# Patient Record
Sex: Male | Born: 1975 | Race: White | Hispanic: No | Marital: Married | Smoking: Never smoker
Health system: Southern US, Community
[De-identification: ages and names within clinical notes are randomized; demographics above are authoritative.]

---

## 2011-04-29 ENCOUNTER — Ambulatory Visit: Payer: Self-pay | Admitting: Family Medicine

## 2012-04-08 IMAGING — RF DG UGI W/ KUB
1 series · 15 of 24 positions shown · non-contrast
Comparison: none

REASON FOR EXAM: dysphagia
COMMENTS:

[Series 1: run · 28 acquisitions, 15 frames shown]
[im 1/28]
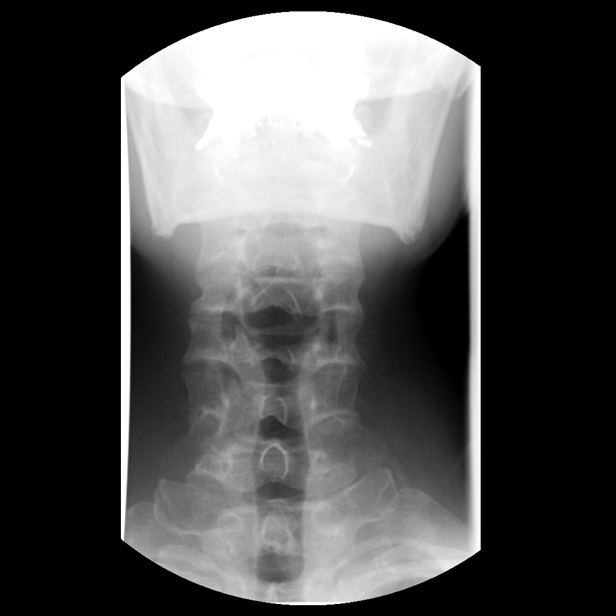
[im 1/28]
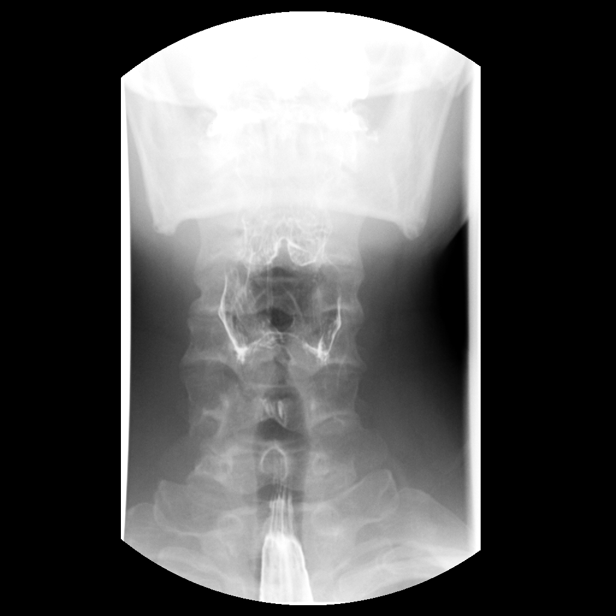
[im 2/28]
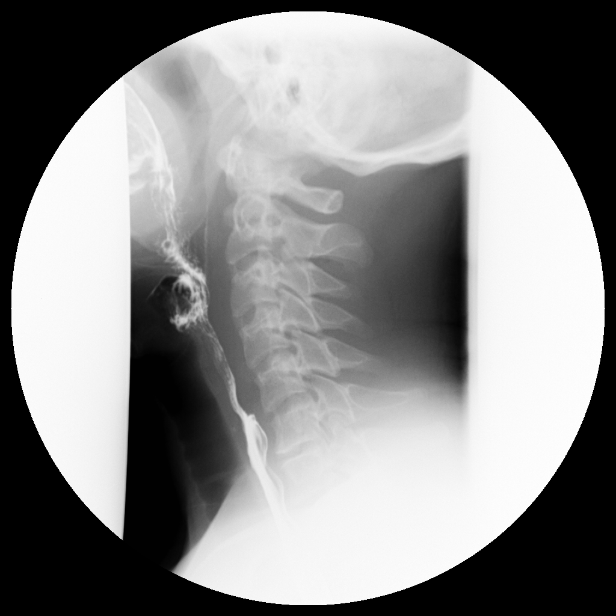
[im 3/28]
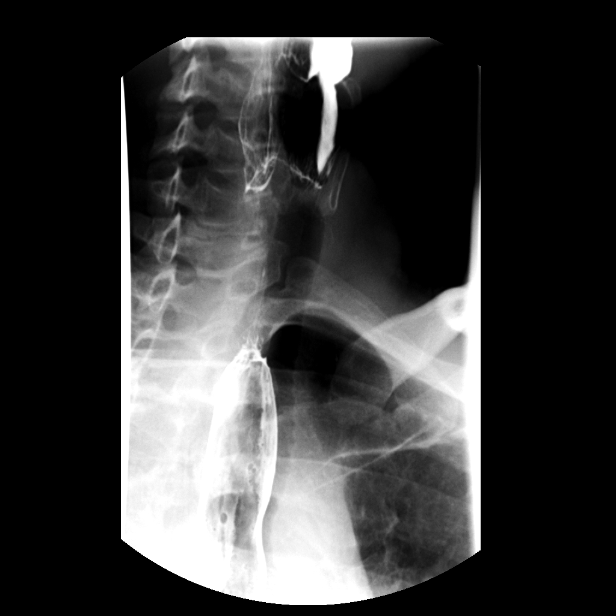
[im 4/28]
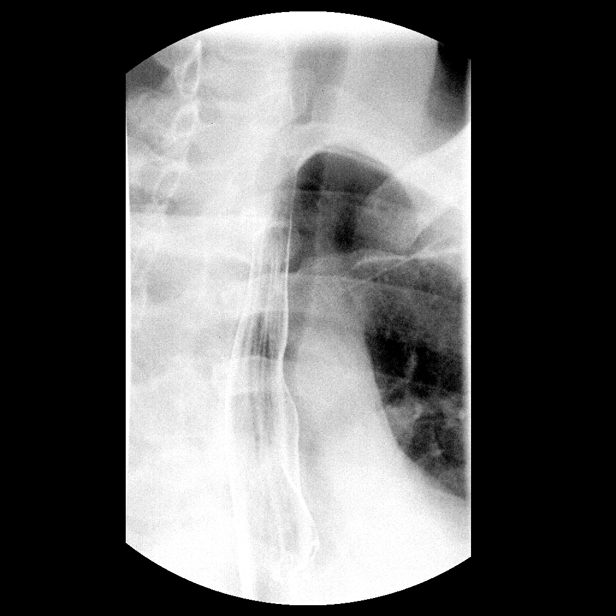
[im 5/28]
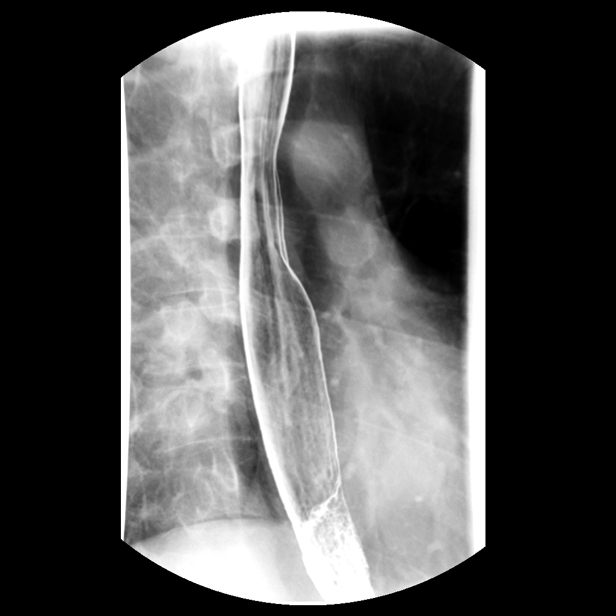
[im 8/28]
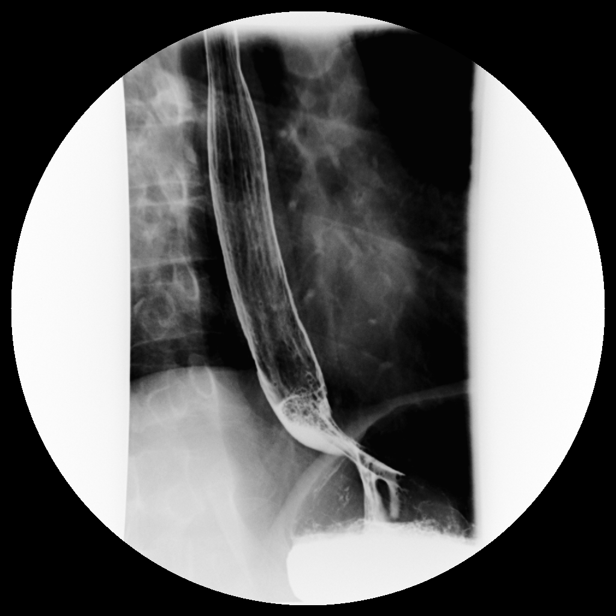
[im 9/28]
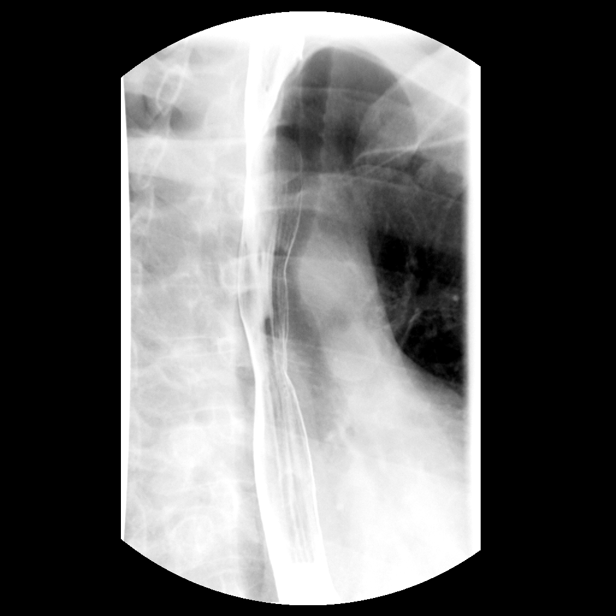
[im 10/28]
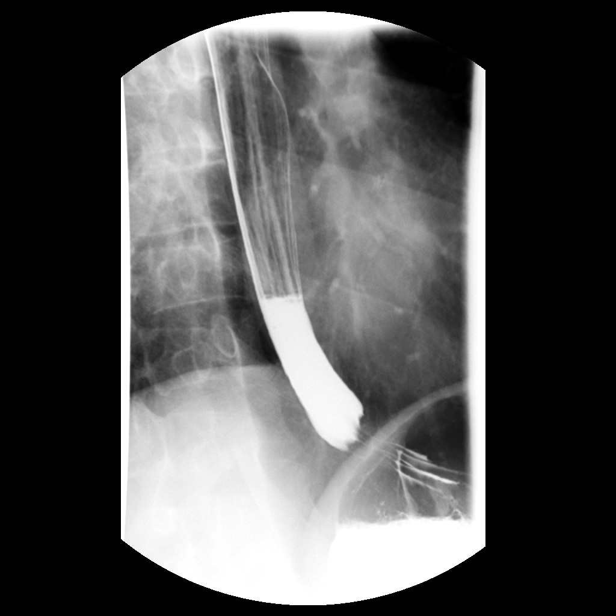
[im 12/28]
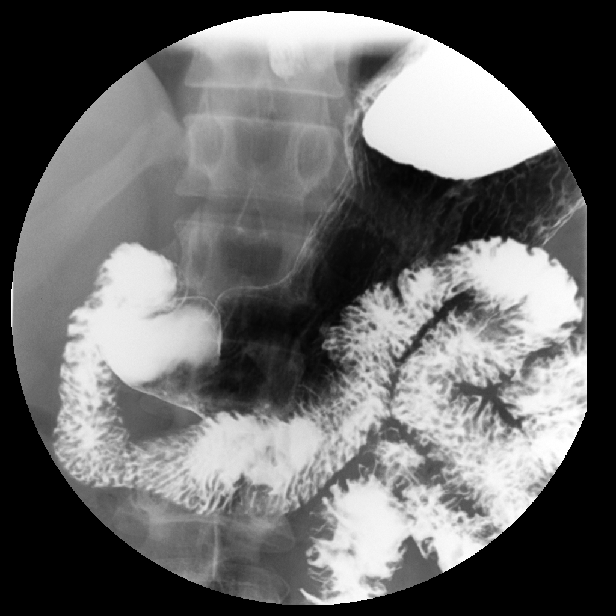
[im 15/28]
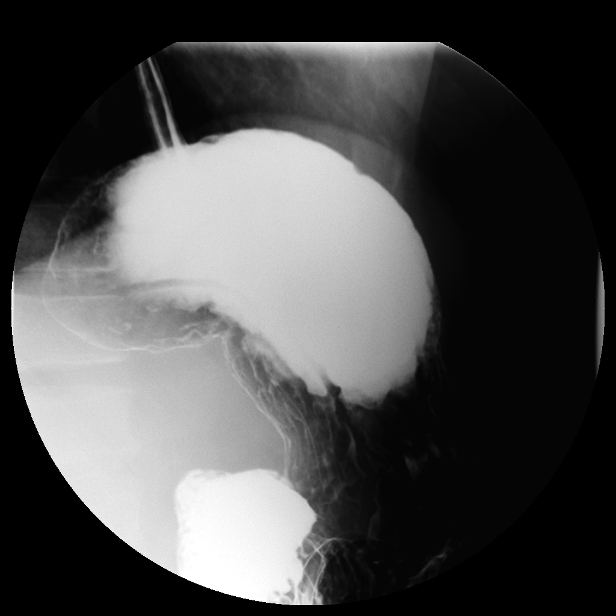
[im 18/28]
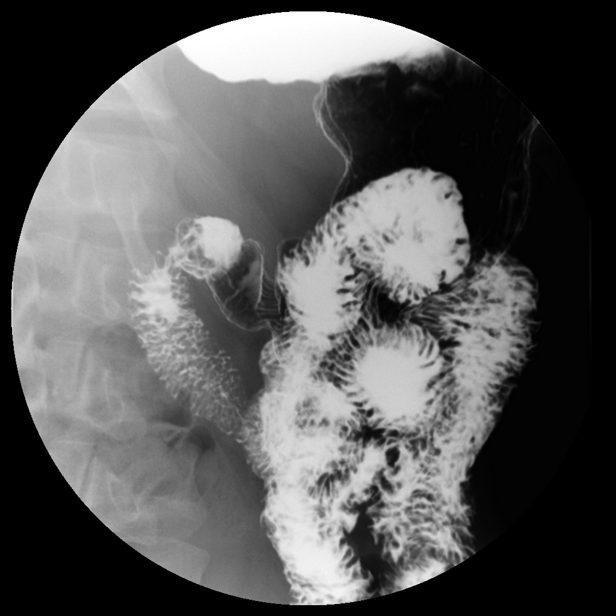
[im 22/28]
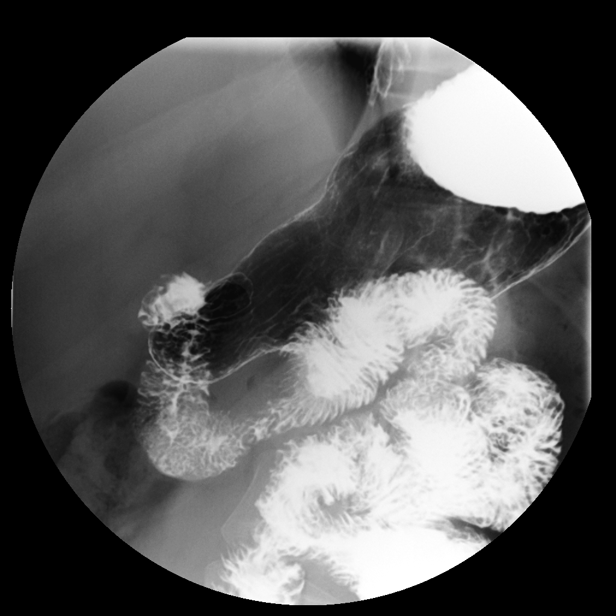
[im 24/28]
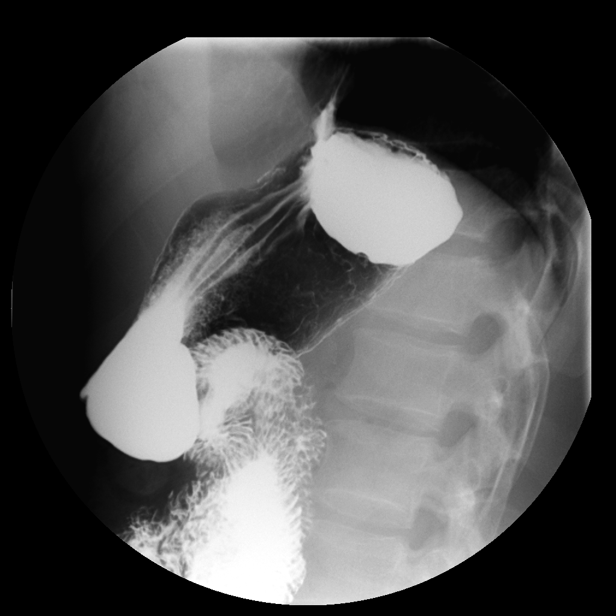
[im 28/28]
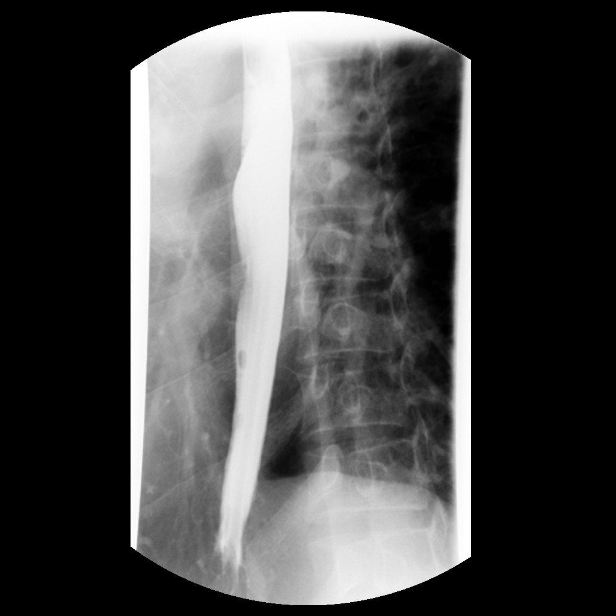

[15 of 24 positions shown; findings below may reference images not displayed]

PROCEDURE:     FL  - FL UPPER GI W/ BARIUM SWALLOW  - April 29, 2011 [DATE]

RESULT:     Following oral administration of effervescent crystals, barium
was administered and spot imaging of the esophagus and upper gastric
gastrointestinal tract was obtained. Images were obtained during oral barium
administration.
FINDINGS: Dynamic evaluation of the cervical esophagus during active
swallowing is unremarkable. The thoracic esophagus is unremarkable. There is
appropriate relaxation of the lower esophageal sphincter. There is no
evidence of hiatal hernia, foci of strictural narrowing, segmental
narrowing, focal outpouchings or fusiform dilatation. Minimal
gastroesophageal reflux was elicited to the lower esophagus. A 12.5 mm
Barotab was administered which traversed the esophagus without complications.

The stomach, duodenum and duodenal bulb are unremarkable. There is
appropriate rotation of the duodenum and placement of the ligament of Treitz.
IMPRESSION: Minimal gastroesophageal reflux otherwise unremarkable upper GI/barium
swallow.

## 2022-11-18 ENCOUNTER — Ambulatory Visit (INDEPENDENT_AMBULATORY_CARE_PROVIDER_SITE_OTHER): Payer: Self-pay | Admitting: Family Medicine

## 2022-11-18 ENCOUNTER — Encounter: Payer: Self-pay | Admitting: Family Medicine

## 2022-11-18 VITALS — BP 120/78 | HR 68 | Ht 69.0 in | Wt 202.0 lb

## 2022-11-18 DIAGNOSIS — Z6829 Body mass index (BMI) 29.0-29.9, adult: Secondary | ICD-10-CM

## 2022-11-18 NOTE — Patient Instructions (Signed)
GUIDELINES FOR  LOW-CHOLESTEROL, LOW-TRIGLYCERIDE DIETS    FOODS TO USE   MEATS, FISH Choose lean meats (chicken, [place], veal, and non-fatty cuts of beef with excess fat trimmed; one serving = 3 oz of cooked meat). Also, fresh or frozen fish, canned fish packed in water, and shellfish (lobster, crabs, shrimp, and oysters). Limit use to no more than one serving of one of these per week. Shellfish are high in cholesterol but low in saturated fat and should be used sparingly. Meats and fish should be broiled (pan or oven) or baked on a rack.  EGGS Egg substitutes and egg whites (use freely). Egg yolks (limit two per week).  FRUITS Eat three servings of fresh fruit per day (1 serving =  cup). Be sure to have at least one citrus fruit daily. Frozen and canned fruit with no sugar or syrup added may be used.  VEGETABLES Most vegetables are not limited (see next page). One dark-green (string beans, escarole) or one deep yellow (squash) vegetable is recommended daily. Cauliflower, broccoli, and celery, as well as potato skins, are recommended for their fiber content. (Fiber is associated with cholesterol reduction) It is preferable to steam vegetables, but they may be boiled, strained, or braised with polyunsaturated vegetable oil (see below).  BEANS Dried peas or beans (1 serving =  cup) may be used as a bread substitute.  NUTS Almonds, walnuts, and peanuts may be used sparingly  (1 serving = 1 Tablespoonful). Use pumpkin, sesame, or sunflower seeds.  BREADS, GRAINS One roll or one slice of whole grain or enriched bread may be used, or three soda crackers or four pieces of melba toast as a substitute. Spaghetti, rice or noodles ( cup) or  large ear of corn may be used as a bread substitute. In preparing these foods do not use butter or shortening, use soft margarine. Also use egg and sugar substitutes.  Choose high fiber grains, such as oats and whole wheat.  CEREALS Use  cup of hot cereal or  cup of  cold cereal per day. Add a sugar substitute if desired, with 99% fat free or skim milk.  MILK PRODUCTS Always use 99% fat free or skim milk, dairy products such as low fat cheeses (farmer's uncreamed diet cottage), low-fat yogurt, and powdered skim milk.  FATS, OILS Use soft (not stick) margarine; vegetable oils that are high in polyunsaturated fats (such as safflower, sunflower, soybean, corn, and cottonseed). Always refrigerate meat drippings to harden the fat and remove it before preparing gravies  DESSERTS, SNACKS Limit to two servings per day; substitute each serving for a bread/cereal serving: ice milk, water sherbet (1/4 cup); unflavored gelatin or gelatin flavored with sugar substitute (1/3 cup); pudding prepared with skim milk (1/2 cup); egg white souffls; unbuttered popcorn (1  cups). Substitute carob for chocolate.  BEVERAGES Fresh fruit juices (limit 4 oz per day); black coffee, plain or herbal teas; soft drinks with sugar substitutes; club soda, preferably salt-free; cocoa made with skim milk or nonfat dried milk and water (sugar substitute added if desired); clear broth. Alcohol: limit two servings per day (see second page).  MISCELLANEOUS  You may use the following freely: vinegar, spices, herbs, nonfat bouillon, mustard, Worcestershire sauce, soy sauce, flavoring essence.                  GUIDELINES FOR  LOW-CHOLESTEROL, LOW TRIGLYCERIDE DIETS    FOODS TO AVOID   MEATS, FISH Marbled beef, pork, bacon, sausage, and other pork products; fatty  fowl (duck, goose); skin and fat of turkey and chicken; processed meats; luncheon meats (salami, bologna); frankfurters and fast-food hamburgers (theyre loaded with fat); organ meats (kidneys, liver); canned fish packed in oil.  °EGGS Limit egg yolks to two per week.   °FRUITS Coconuts (rich in saturated fats).  °VEGETABLES Avoid avocados. Starchy vegetables (potatoes, corn, lima beans, dried peas, beans) may be used only if  substitutes for a serving of bread or cereal. (Baked potato skin, however, is desirable for its fiber content.  °BEANS Commercial baked beans with sugar and/or pork added.  °NUTS Avoid nuts.  Limit peanuts and walnuts to one tablespoonful per day.  °BREADS, GRAINS Any baked goods with shortening and/or sugar. Commercial mixes with dried eggs and whole milk. Avoid sweet rolls, doughnuts, breakfast pastries (Danish), and sweetened packaged cereals (the added sugar converts readily to triglycerides).  °MILK PRODUCTS Whole milk and whole-milk packaged goods; cream; ice cream; whole-milk puddings, yogurt, or cheeses; nondairy cream substitutes.  °FATS, OILS Butter, lard, animal fats, bacon drippings, gravies, cream sauces as well as palm and coconut oils. All these are high in saturated fats. Examine labels on cholesterol free products for hydrogenated fats. (These are oils that have been hardened into solids and in the process have become saturated.)  °DESSERTS, SNACKS Fried snack foods like potato chips; chocolate; candies in general; jams, jellies, syrups; whole- milk puddings; ice cream and milk sherbets; hydrogenated peanut butter.  °BEVERAGES Sugared fruit juices and soft drinks; cocoa made with whole milk and/or sugar. When using alcohol (1 oz liquor, 5 oz beer, or 2 ½ oz dry table wine per serving), one serving must be substituted for one bread or cereal serving (limit, two servings of alcohol per day).  ° SPECIAL NOTES  °  Remember that even non-limited foods should be used in moderation. °While on a cholesterol-lowering diet, be sure to avoid animal fats and marbled meats. °3. While on a triglyceride-lowering diet, be sure to avoid sweets and to control the amount of carbohydrates you eat (starchy foods such as flour, bread, potatoes).While on a tri-glyceride-lowering diet, be sure to avoid sweets °Buy a good low-fat cookbook, such as the one published by the American Heart Association. °Consult your physician  if you have any questions.  ° ° ° ° ° ° ° ° ° ° ° ° ° °[facility] Low Glycemic Diet Plan ° ° °Low Glycemic Foods (20-49) Moderate Glycemic Foods (50-69) High Glycemic Foods (70-100)  °    °Breakfast Creals Breakfast Cereals Breakfast Cereals  °All Bran All-Bran Fruit'n Oats  ° Bran Buds Bran Chex  ° Cheerios Corn chex  °  °Fiber One Oatmeal (not instant)  ° Just Right Mini-Wheats  ° Corn Flakes Cream of Wheat  °  °Oat Bran Special K Swiss Muesli  ° Grape Nuts Grape Nut Flakes  °  °  Grits Nutri-Grain  °  °Fruits and fruit juice: Fruits Puffed Rice Puffed Wheat  °  °(Limit to 1-2 Servings per day) Banana (under-ride) Dates  ° Rice Chex Rice Krispies  °  °Apples Apricots (fresh/dried)  ° Figs Grapes  ° Shredded Wheat Team  °  °Blackberries Blueberries  ° Kiwi Mango  ° Total   °  °Cherries Cranberries  ° Oranges Raisins  °   °Peaches Pears  °  Fruits  °Plums Prunes  ° Fruit Juices Pineapple Watermelon  °  °Grapefruit Raspberries  ° Cranberry Juice Orange Juice  ° Banana (over-ripe)   °  °Strawberries Tangerines  °    °  Apple Juice Grapefruit Juice  ° Beans and Legumes Beverages  °Tomato Juice   ° [place]-type baked beans Sodas, sweet tea, pineapple juice  ° Canned pinto, kidney, or navy beans   °Beans and Legumes (fresh-cooked) Green peas Vegetables  °Black-eyed peas Butter Beans  °  Potato, baked, boiled, fried, mashed  °Chick peas Lentils  ° Vegetables French fries  °Green beans Lima beans  ° Beets Carrots  ° Canned or frozen corn  °Kidney beans Navy beans  ° Sweet potato Yam  ° Parsnips  °Pinto beans Snow peas  ° Corn on the cob Winter squash  °    °Non-starchy vegetables Grains Breads  °Asparagus, avocado, broccoli, cabbage Cornmeal Rice, brown  ° Most breads (white and whole grain)  °cauliflower, celery, cucumber, greens Rice, white Couscous  ° Bagels Bread sticks  °  °lettuce, mushrooms, peppers, tomatoes  Bread stuffing Kaiser roll  °  °okra, onions, spinach, summer squash Pasta Dinner rolls  ° Macaroni  Pizza, cheese  °   °Grains Ravioli, meat filled Spaghetti, white  ° Grains  °Barley Bulgur  °  Rice, instant Tapioca, with milk  °  °Rye Wild rice  ° Nuts   ° Cashews Macadamia  ° Candy and most cookies  °Nuts and oils    °Almonds, peanuts, sunflower seeds Snacks Snacks  °hazelnuts, pecans, walnuts Chocolate Ice cream, lowfat  ° Donuts Corn chips  °  °Oils that are liquid at room temperature Muffin Popcorn  ° Jelly beans Pretzels  °  °  Pastries  °Dairy, fish, meat, soy, and eggs    °Milk, skim Lowfat cheese  °  Restaurant and ethnic foods  °Yogurt, lowfat, fruit sugar sweetened  Most Chinese food (sugar in stir fry  °  or wok sauce)  °Lean red meat Fish  °  Teriyaki-style meats and vegetables  °Skinless chicken and turkey, shellfish    °    °Egg whites (up to 3 daily), Soy Products    °Egg yolks (up to 7 or _____ per week)    °  °

## 2022-11-18 NOTE — Progress Notes (Signed)
Date:  11/18/2022   Name:  Shaun Rivera   DOB:  09-07-1976   MRN:  [id number]   Chief Complaint: No chief complaint on file.  Patient is a 47 year old male who presents for a establish care new patient exam. The patient reports the following problems: none. Health maintenance has been reviewed up to date.      No results found for: "NA", "K", "CO2", "GLUCOSE", "BUN", "CREATININE", "CALCIUM", "EGFR", "GFRNONAA" No results found for: "CHOL", "HDL", "LDLCALC", "LDLDIRECT", "TRIG", "CHOLHDL" No results found for: "TSH" No results found for: "HGBA1C" No results found for: "WBC", "HGB", "HCT", "MCV", "PLT" No results found for: "ALT", "AST", "GGT", "ALKPHOS", "BILITOT" No results found for: "25OHVITD2", "25OHVITD3", "VD25OH"   Review of Systems  Constitutional:  Negative for chills and fever.  HENT:  Negative for drooling, ear discharge, ear pain and sore throat.   Respiratory:  Negative for cough, shortness of breath and wheezing.   Cardiovascular:  Negative for chest pain, palpitations and leg swelling.  Gastrointestinal:  Negative for abdominal pain, blood in stool, constipation, diarrhea and nausea.  Endocrine: Negative for polydipsia.  Genitourinary:  Negative for dysuria, frequency, hematuria and urgency.  Musculoskeletal:  Negative for back pain, myalgias and neck pain.  Skin:  Negative for rash.  Allergic/Immunologic: Negative for environmental allergies.  Neurological:  Negative for dizziness and headaches.  Hematological:  Does not bruise/bleed easily.  Psychiatric/Behavioral:  Negative for suicidal ideas. The patient is not nervous/anxious.     There are no problems to display for this patient.   No Known Allergies    Social History   Tobacco Use   Smoking status: Never   Smokeless tobacco: Former    Quit date: 2000  Substance Use Topics   Alcohol use: Yes    Comment: weekend   Drug use: Never     Medication list has been reviewed and  updated.  Current Meds  Medication Sig   CREATINE PO Take by mouth.   Multiple Vitamin (MULTI-VITAMIN) tablet Take 1 tablet by mouth every morning.       11/18/2022    2:48 PM  GAD 7 : Generalized Anxiety Score  Nervous, Anxious, on Edge 0  Control/stop worrying 0  Worry too much - different things 0  Trouble relaxing 0  Restless 0  Easily annoyed or irritable 0  Afraid - awful might happen 0  Total GAD 7 Score 0  Anxiety Difficulty Not difficult at all       11/18/2022    2:48 PM  Depression screen PHQ 2/9  Decreased Interest 0  Down, Depressed, Hopeless 0  PHQ - 2 Score 0  Altered sleeping 0  Tired, decreased energy 0  Change in appetite 0  Feeling bad or failure about yourself  0  Trouble concentrating 0  Moving slowly or fidgety/restless 0  Suicidal thoughts 0  PHQ-9 Score 0  Difficult doing work/chores Not difficult at all    BP Readings from Last 3 Encounters:  11/18/22 120/78    Physical Exam Vitals and nursing note reviewed.  HENT:     Head: Normocephalic.     Right Ear: Tympanic membrane and external ear normal.     Left Ear: Tympanic membrane and external ear normal.     Nose: Nose normal.     Mouth/Throat:     Mouth: Mucous membranes are moist.  Eyes:     General: No scleral icterus.       Right eye: No discharge.  Left eye: No discharge.     Conjunctiva/sclera: Conjunctivae normal.     Pupils: Pupils are equal, round, and reactive to light.  Neck:     Thyroid: No thyromegaly.     Vascular: No JVD.     Trachea: No tracheal deviation.  Cardiovascular:     Rate and Rhythm: Normal rate and regular rhythm.     Heart sounds: Normal heart sounds. No murmur heard.    No friction rub. No gallop.  Pulmonary:     Effort: No respiratory distress.     Breath sounds: Normal breath sounds. No wheezing, rhonchi or rales.  Abdominal:     General: Bowel sounds are normal.     Palpations: Abdomen is soft. There is no mass.     Tenderness: There is  no abdominal tenderness. There is no guarding or rebound.  Musculoskeletal:        General: No tenderness.     Cervical back: Normal range of motion and neck supple.  Lymphadenopathy:     Cervical: No cervical adenopathy.  Skin:    Findings: No rash.  Neurological:     Mental Status: He is alert.     Cranial Nerves: No cranial nerve deficit.     Wt Readings from Last 3 Encounters:  11/18/22 202 lb (91.6 kg)    BP 120/78   Pulse 68   Ht 5\' 9"  (1.753 m)   Wt 202 lb (91.6 kg)   SpO2 98%   BMI 29.83 kg/m   Assessment and Plan:  1. BMI 29.0-29.9,adult Patient with BMI of 29 which is mostly of a muscular nature however we will suggest an advance that he began a low-cholesterol low triglyceride diet and we will recheck a lipid panel upon return for physical exam.   Otilio Miu, MD

## 2022-12-10 ENCOUNTER — Ambulatory Visit (INDEPENDENT_AMBULATORY_CARE_PROVIDER_SITE_OTHER): Payer: 59 | Admitting: Family Medicine

## 2022-12-10 ENCOUNTER — Encounter: Payer: Self-pay | Admitting: Family Medicine

## 2022-12-10 VITALS — BP 128/78 | Ht 69.0 in | Wt 199.0 lb

## 2022-12-10 DIAGNOSIS — Z0000 Encounter for general adult medical examination without abnormal findings: Secondary | ICD-10-CM | POA: Diagnosis not present

## 2022-12-10 LAB — HEMOCCULT GUIAC POC 1CARD (OFFICE): Fecal Occult Blood, POC: NEGATIVE

## 2022-12-10 NOTE — Progress Notes (Signed)
Date:  12/10/2022   Name:  Shaun Rivera   DOB:  07-Jan-1976   MRN:  [id number]   Chief Complaint: Annual Exam  Patient is a 47 year old male who presents for a comprehensive physical exam. The patient reports the following problems: none. Health maintenance has been reviewed up to date.      No results found for: "NA", "K", "CO2", "GLUCOSE", "BUN", "CREATININE", "CALCIUM", "EGFR", "GFRNONAA" No results found for: "CHOL", "HDL", "LDLCALC", "LDLDIRECT", "TRIG", "CHOLHDL" No results found for: "TSH" No results found for: "HGBA1C" No results found for: "WBC", "HGB", "HCT", "MCV", "PLT" No results found for: "ALT", "AST", "GGT", "ALKPHOS", "BILITOT" No results found for: "25OHVITD2", "25OHVITD3", "VD25OH"   Review of Systems  Constitutional:  Negative for chills and fever.  HENT:  Negative for drooling, ear discharge, ear pain and sore throat.   Respiratory:  Negative for cough, shortness of breath and wheezing.   Cardiovascular:  Negative for chest pain, palpitations and leg swelling.  Gastrointestinal:  Negative for abdominal pain, blood in stool, constipation, diarrhea and nausea.  Endocrine: Negative for polydipsia.  Genitourinary:  Negative for dysuria, frequency, hematuria and urgency.  Musculoskeletal:  Negative for back pain, myalgias and neck pain.  Skin:  Negative for rash.  Allergic/Immunologic: Negative for environmental allergies.  Neurological:  Negative for dizziness and headaches.  Hematological:  Does not bruise/bleed easily.  Psychiatric/Behavioral:  Negative for suicidal ideas. The patient is not nervous/anxious.     There are no problems to display for this patient.   No Known Allergies  No past surgical history on file.  Social History   Tobacco Use   Smoking status: Never   Smokeless tobacco: Former    Quit date: 2000  Substance Use Topics   Alcohol use: Yes    Comment: weekend   Drug use: Never     Medication list has been reviewed and  updated.  Current Meds  Medication Sig   CREATINE PO Take by mouth.   Multiple Vitamin (MULTI-VITAMIN) tablet Take 1 tablet by mouth every morning.       12/10/2022    8:07 AM 11/18/2022    2:48 PM  GAD 7 : Generalized Anxiety Score  Nervous, Anxious, on Edge 0 0  Control/stop worrying 0 0  Worry too much - different things 0 0  Trouble relaxing 0 0  Restless 0 0  Easily annoyed or irritable 0 0  Afraid - awful might happen 0 0  Total GAD 7 Score 0 0  Anxiety Difficulty Not difficult at all Not difficult at all       12/10/2022    8:07 AM 11/18/2022    2:48 PM  Depression screen PHQ 2/9  Decreased Interest 0 0  Down, Depressed, Hopeless 0 0  PHQ - 2 Score 0 0  Altered sleeping 0 0  Tired, decreased energy 0 0  Change in appetite 0 0  Feeling bad or failure about yourself  0 0  Trouble concentrating 0 0  Moving slowly or fidgety/restless 0 0  Suicidal thoughts 0 0  PHQ-9 Score 0 0  Difficult doing work/chores Not difficult at all Not difficult at all    BP Readings from Last 3 Encounters:  12/10/22 128/78  11/18/22 120/78    Physical Exam Vitals and nursing note reviewed.  Constitutional:      Appearance: Normal appearance. He is well-developed and well-groomed.  HENT:     Head: Normocephalic.     Jaw: There is normal  jaw occlusion.     Right Ear: Hearing, tympanic membrane, ear canal and external ear normal.     Left Ear: Hearing, tympanic membrane, ear canal and external ear normal.     Nose: Nose normal.     Mouth/Throat:     Lips: Pink.     Mouth: Mucous membranes are moist.     Dentition: Normal dentition.     Tongue: No lesions.     Palate: No mass.     Pharynx: Oropharynx is clear.  Eyes:     General: Lids are normal. Vision grossly intact. Gaze aligned appropriately. No scleral icterus.       Right eye: No discharge.        Left eye: No discharge.     Extraocular Movements: Extraocular movements intact.     Conjunctiva/sclera: Conjunctivae normal.      Pupils: Pupils are equal, round, and reactive to light.  Neck:     Thyroid: No thyroid mass, thyromegaly or thyroid tenderness.     Vascular: Normal carotid pulses. No carotid bruit, hepatojugular reflux or JVD.     Trachea: Trachea and phonation normal. No tracheal deviation.  Cardiovascular:     Rate and Rhythm: Normal rate and regular rhythm.     Chest Wall: PMI is not displaced.     Pulses: Normal pulses.          Carotid pulses are 2+ on the right side and 2+ on the left side.      Radial pulses are 2+ on the right side and 2+ on the left side.       Femoral pulses are 2+ on the right side and 2+ on the left side.      Popliteal pulses are 2+ on the right side and 2+ on the left side.       Dorsalis pedis pulses are 2+ on the right side and 2+ on the left side.       Posterior tibial pulses are 2+ on the right side and 2+ on the left side.     Heart sounds: Normal heart sounds, S1 normal and S2 normal. No murmur heard.    No systolic murmur is present.     No diastolic murmur is present.     No friction rub. No gallop. No S3 or S4 sounds.  Pulmonary:     Effort: No respiratory distress.     Breath sounds: Normal breath sounds. No decreased air movement. No decreased breath sounds, wheezing, rhonchi or rales.  Chest:  Breasts:    Breasts are symmetrical.     Right: Normal.     Left: Normal.  Abdominal:     General: Abdomen is flat. Bowel sounds are normal.     Palpations: Abdomen is soft. There is no hepatomegaly, splenomegaly or mass.     Tenderness: There is no abdominal tenderness. There is no guarding or rebound.  Genitourinary:    Penis: Normal and uncircumcised.      Testes: Normal.        Right: Mass not present.        Left: Mass not present.     Epididymis:     Right: Normal.     Left: Normal.     Prostate: Normal. Not enlarged, not tender and no nodules present.     Rectum: Normal. Guaiac result negative. No mass.  Musculoskeletal:        General: No  tenderness. Normal range of motion.  Cervical back: Full passive range of motion without pain, normal range of motion and neck supple.     Right lower leg: No edema.     Left lower leg: No edema.  Lymphadenopathy:     Head:     Right side of head: No submental, submandibular or tonsillar adenopathy.     Left side of head: No submental, submandibular or tonsillar adenopathy.     Cervical: No cervical adenopathy.     Right cervical: No superficial, deep or posterior cervical adenopathy.    Left cervical: No superficial, deep or posterior cervical adenopathy.     Upper Body:     Right upper body: No supraclavicular or axillary adenopathy.     Left upper body: No supraclavicular or axillary adenopathy.  Skin:    General: Skin is warm.     Capillary Refill: Capillary refill takes less than 2 seconds.     Findings: No rash.  Neurological:     Mental Status: He is alert and oriented to person, place, and time.     Cranial Nerves: Cranial nerves 2-12 are intact. No cranial nerve deficit.     Sensory: Sensation is intact.     Motor: Motor function is intact.     Coordination: Romberg sign negative.     Deep Tendon Reflexes: Reflexes are normal and symmetric.  Psychiatric:        Behavior: Behavior is cooperative.     Wt Readings from Last 3 Encounters:  12/10/22 199 lb (90.3 kg)  11/18/22 202 lb (91.6 kg)    BP 128/78   Ht '5\' 9"'$  (1.753 m)   Wt 199 lb (90.3 kg)   BMI 29.39 kg/m   Assessment and Plan:  Nikhil Opsahl is a 47 y.o. male who presents today for his Complete Annual Exam. He feels well. He reports exercising . He reports he is fair.Immunizations are reviewed and recommendations provided.   Age appropriate screening tests are discussed. Counseling given for risk factor reduction interventions.  1. Annual physical exam No subjective/objective concerns noted during HPI, review of past medical history and labs, review of systems, and physical exam.  Will check lipid panel  and CMP for lab values.  And patient has been encouraged to return next year for repeat physical exam. - POCT Occult Blood Stool - Lipid Panel With LDL/HDL Ratio - Comprehensive Metabolic Panel (CMET)   Otilio Miu, MD

## 2022-12-11 LAB — COMPREHENSIVE METABOLIC PANEL
ALT: 76 IU/L — ABNORMAL HIGH (ref 0–44)
AST: 64 IU/L — ABNORMAL HIGH (ref 0–40)
Albumin/Globulin Ratio: 2 (ref 1.2–2.2)
Albumin: 4.7 g/dL (ref 4.1–5.1)
Alkaline Phosphatase: 99 IU/L (ref 44–121)
BUN/Creatinine Ratio: 17 (ref 9–20)
BUN: 18 mg/dL (ref 6–24)
Bilirubin Total: 0.5 mg/dL (ref 0.0–1.2)
CO2: 24 mmol/L (ref 20–29)
Calcium: 9.5 mg/dL (ref 8.7–10.2)
Chloride: 96 mmol/L (ref 96–106)
Creatinine, Ser: 1.03 mg/dL (ref 0.76–1.27)
Globulin, Total: 2.4 g/dL (ref 1.5–4.5)
Glucose: 72 mg/dL (ref 70–99)
Potassium: 4.5 mmol/L (ref 3.5–5.2)
Sodium: 136 mmol/L (ref 134–144)
Total Protein: 7.1 g/dL (ref 6.0–8.5)
eGFR: 91 mL/min/{1.73_m2} (ref >59)

## 2022-12-11 LAB — LIPID PANEL WITH LDL/HDL RATIO
Cholesterol, Total: 272 mg/dL — ABNORMAL HIGH (ref 100–199)
HDL: 64 mg/dL (ref >39)
LDL Chol Calc (NIH): 150 mg/dL — ABNORMAL HIGH (ref 0–99)
LDL/HDL Ratio: 2.3 ratio (ref 0.0–3.6)
Triglycerides: 318 mg/dL — ABNORMAL HIGH (ref 0–149)
VLDL Cholesterol Cal: 58 mg/dL — ABNORMAL HIGH (ref 5–40)

## 2022-12-17 ENCOUNTER — Encounter: Payer: Self-pay | Admitting: Family Medicine

## 2022-12-17 ENCOUNTER — Ambulatory Visit: Payer: 59 | Admitting: Family Medicine

## 2022-12-17 VITALS — BP 128/78 | HR 110 | Temp 97.8°F | Ht 69.0 in | Wt 204.0 lb

## 2022-12-17 DIAGNOSIS — J0110 Acute frontal sinusitis, unspecified: Secondary | ICD-10-CM | POA: Diagnosis not present

## 2022-12-17 MED ORDER — AMOXICILLIN 500 MG PO CAPS: 500.0000 mg | ORAL_CAPSULE | Freq: Three times a day (TID) | ORAL | 0 refills | Status: AC

## 2022-12-17 MED ORDER — TRIAMCINOLONE ACETONIDE 55 MCG/ACT NA AERO: 2.0000 | INHALATION_SPRAY | Freq: Every day | NASAL | 12 refills | Status: AC

## 2022-12-17 NOTE — Patient Instructions (Signed)

## 2022-12-17 NOTE — Progress Notes (Unsigned)
Date:  12/17/2022   Name:  Shaun Rivera   DOB:  Sep 15, 1976   MRN:  [id number]   Chief Complaint: Sinusitis (Green production, sinus pressure, no fever)  Sinusitis This is a new problem. The current episode started in the past 7 days. The problem has been gradually improving since onset. There has been no fever. The pain is mild. Associated symptoms include coughing, sinus pressure and a sore throat. Pertinent negatives include no chills, congestion, diaphoresis, ear pain, shortness of breath or sneezing. Past treatments include nothing. The treatment provided moderate relief.    Lab Results  Component Value Date   NA 136 12/10/2022   K 4.5 12/10/2022   CO2 24 12/10/2022   GLUCOSE 72 12/10/2022   BUN 18 12/10/2022   CREATININE 1.03 12/10/2022   CALCIUM 9.5 12/10/2022   EGFR 91 12/10/2022   Lab Results  Component Value Date   CHOL 272 (H) 12/10/2022   HDL 64 12/10/2022   LDLCALC 150 (H) 12/10/2022   TRIG 318 (H) 12/10/2022   No results found for: "TSH" No results found for: "HGBA1C" No results found for: "WBC", "HGB", "HCT", "MCV", "PLT" Lab Results  Component Value Date   ALT 76 (H) 12/10/2022   AST 64 (H) 12/10/2022   ALKPHOS 99 12/10/2022   BILITOT 0.5 12/10/2022   No results found for: "25OHVITD2", "25OHVITD3", "VD25OH"   Review of Systems  Constitutional:  Negative for chills and diaphoresis.  HENT:  Positive for sinus pressure and sore throat. Negative for congestion, ear pain, postnasal drip, rhinorrhea, sinus pain and sneezing.   Respiratory:  Positive for cough. Negative for chest tightness, shortness of breath and wheezing.   Cardiovascular:  Negative for chest pain, palpitations and leg swelling.  Gastrointestinal:  Negative for blood in stool.    There are no problems to display for this patient.   No Known Allergies  No past surgical history on file.  Social History   Tobacco Use   Smoking status: Never   Smokeless tobacco: Former    Quit  date: 2000  Substance Use Topics   Alcohol use: Yes    Comment: weekend   Drug use: Never     Medication list has been reviewed and updated.  Current Meds  Medication Sig   CREATINE PO Take by mouth.   Multiple Vitamin (MULTI-VITAMIN) tablet Take 1 tablet by mouth every morning.       12/17/2022    4:16 PM 12/10/2022    8:07 AM 11/18/2022    2:48 PM  GAD 7 : Generalized Anxiety Score  Nervous, Anxious, on Edge 0 0 0  Control/stop worrying 0 0 0  Worry too much - different things 0 0 0  Trouble relaxing 0 0 0  Restless 0 0 0  Easily annoyed or irritable 0 0 0  Afraid - awful might happen 0 0 0  Total GAD 7 Score 0 0 0  Anxiety Difficulty Not difficult at all Not difficult at all Not difficult at all       12/17/2022    4:16 PM 12/10/2022    8:07 AM 11/18/2022    2:48 PM  Depression screen PHQ 2/9  Decreased Interest 0 0 0  Down, Depressed, Hopeless 0 0 0  PHQ - 2 Score 0 0 0  Altered sleeping 0 0 0  Tired, decreased energy 0 0 0  Change in appetite 0 0 0  Feeling bad or failure about yourself  0 0 0  Trouble  concentrating 0 0 0  Moving slowly or fidgety/restless 0 0 0  Suicidal thoughts 0 0 0  PHQ-9 Score 0 0 0  Difficult doing work/chores Not difficult at all Not difficult at all Not difficult at all    BP Readings from Last 3 Encounters:  12/17/22 128/78  12/10/22 128/78  11/18/22 120/78    Physical Exam Vitals and nursing note reviewed.  HENT:     Head: Normocephalic.     Right Ear: External ear normal.     Left Ear: External ear normal.     Nose: Nose normal.  Eyes:     General: No scleral icterus.       Right eye: No discharge.        Left eye: No discharge.     Conjunctiva/sclera: Conjunctivae normal.     Pupils: Pupils are equal, round, and reactive to light.  Neck:     Thyroid: No thyromegaly.     Vascular: No JVD.     Trachea: No tracheal deviation.  Cardiovascular:     Rate and Rhythm: Normal rate and regular rhythm.     Heart sounds: Normal  heart sounds. No murmur heard.    No friction rub. No gallop.  Pulmonary:     Effort: No respiratory distress.     Breath sounds: Normal breath sounds. No wheezing, rhonchi or rales.  Abdominal:     General: Bowel sounds are normal.     Palpations: Abdomen is soft. There is no mass.     Tenderness: There is no abdominal tenderness. There is no guarding or rebound.  Musculoskeletal:        General: No tenderness. Normal range of motion.     Cervical back: Normal range of motion and neck supple.  Lymphadenopathy:     Cervical: No cervical adenopathy.  Skin:    General: Skin is warm.     Findings: No bruising, erythema, lesion or rash.  Neurological:     Mental Status: He is alert and oriented to person, place, and time.     Cranial Nerves: No cranial nerve deficit.     Deep Tendon Reflexes: Reflexes are normal and symmetric.     Wt Readings from Last 3 Encounters:  12/17/22 204 lb (92.5 kg)  12/10/22 199 lb (90.3 kg)  11/18/22 202 lb (91.6 kg)    BP 128/78   Pulse (!) 110   Temp 97.8 F (36.6 C) (Oral)   Ht '5\' 9"'$  (1.753 m)   Wt 204 lb (92.5 kg)   SpO2 96%   BMI 30.13 kg/m   Assessment and Plan:  1. Acute non-recurrent frontal sinusitis Acute.  Persistent.  Stable.  Examination and history is consistent with maxillary sinusitis and will treat with amoxicillin 500 mg 3 times a day for 10 days. - amoxicillin (AMOXIL) 500 MG capsule; Take 1 capsule (500 mg total) by mouth 3 (three) times daily for 10 days.  Dispense: 30 capsule; Refill: 0    Otilio Miu, MD

## 2022-12-20 ENCOUNTER — Encounter: Payer: Self-pay | Admitting: Family Medicine

## 2023-03-17 ENCOUNTER — Ambulatory Visit: Payer: Self-pay | Admitting: Family Medicine

## 2023-12-12 ENCOUNTER — Encounter: Payer: Self-pay | Admitting: Family Medicine

## 2023-12-12 ENCOUNTER — Telehealth: Payer: Self-pay

## 2023-12-12 NOTE — Telephone Encounter (Signed)
 Attempted to contact patient to reschedule No Show 12/12/22. Left vm to contact Dr.Jones office.

## 2024-06-28 DIAGNOSIS — D2271 Melanocytic nevi of right lower limb, including hip: Secondary | ICD-10-CM | POA: Diagnosis not present

## 2024-06-28 DIAGNOSIS — L821 Other seborrheic keratosis: Secondary | ICD-10-CM | POA: Diagnosis not present

## 2024-06-28 DIAGNOSIS — D2261 Melanocytic nevi of right upper limb, including shoulder: Secondary | ICD-10-CM | POA: Diagnosis not present

## 2024-06-28 DIAGNOSIS — B078 Other viral warts: Secondary | ICD-10-CM | POA: Diagnosis not present

## 2024-06-28 DIAGNOSIS — D2272 Melanocytic nevi of left lower limb, including hip: Secondary | ICD-10-CM | POA: Diagnosis not present

## 2024-06-28 DIAGNOSIS — R238 Other skin changes: Secondary | ICD-10-CM | POA: Diagnosis not present

## 2024-06-28 DIAGNOSIS — D2262 Melanocytic nevi of left upper limb, including shoulder: Secondary | ICD-10-CM | POA: Diagnosis not present

## 2024-06-28 DIAGNOSIS — D225 Melanocytic nevi of trunk: Secondary | ICD-10-CM | POA: Diagnosis not present

## 2024-08-06 DIAGNOSIS — Z1331 Encounter for screening for depression: Secondary | ICD-10-CM | POA: Diagnosis not present

## 2024-08-06 DIAGNOSIS — Z1321 Encounter for screening for nutritional disorder: Secondary | ICD-10-CM | POA: Diagnosis not present

## 2024-08-06 DIAGNOSIS — Z13220 Encounter for screening for lipoid disorders: Secondary | ICD-10-CM | POA: Diagnosis not present

## 2024-08-06 DIAGNOSIS — Z131 Encounter for screening for diabetes mellitus: Secondary | ICD-10-CM | POA: Diagnosis not present

## 2024-08-06 DIAGNOSIS — E782 Mixed hyperlipidemia: Secondary | ICD-10-CM | POA: Diagnosis not present

## 2024-08-06 DIAGNOSIS — Z1329 Encounter for screening for other suspected endocrine disorder: Secondary | ICD-10-CM | POA: Diagnosis not present
